# Patient Record
Sex: Male | Born: 2000 | Race: Black or African American | Hispanic: No | Marital: Single | Smoking: Never smoker
Health system: Southern US, Community
[De-identification: ages and names within clinical notes are randomized; demographics above are authoritative.]

## PROBLEM LIST (undated history)

## (undated) DIAGNOSIS — Z91018 Allergy to other foods: Secondary | ICD-10-CM

## (undated) DIAGNOSIS — E669 Obesity, unspecified: Secondary | ICD-10-CM

## (undated) DIAGNOSIS — F419 Anxiety disorder, unspecified: Secondary | ICD-10-CM

## (undated) DIAGNOSIS — F988 Other specified behavioral and emotional disorders with onset usually occurring in childhood and adolescence: Secondary | ICD-10-CM

## (undated) DIAGNOSIS — T7840XA Allergy, unspecified, initial encounter: Secondary | ICD-10-CM

## (undated) DIAGNOSIS — E739 Lactose intolerance, unspecified: Secondary | ICD-10-CM

## (undated) HISTORY — DX: Allergy, unspecified, initial encounter: T78.40XA

## (undated) HISTORY — DX: Anxiety disorder, unspecified: F41.9

## (undated) HISTORY — DX: Other specified behavioral and emotional disorders with onset usually occurring in childhood and adolescence: F98.8

## (undated) HISTORY — DX: Allergy to other foods: Z91.018

## (undated) HISTORY — DX: Lactose intolerance, unspecified: E73.9

## (undated) HISTORY — DX: Obesity, unspecified: E66.9

## (undated) HISTORY — PX: HERNIA REPAIR: SHX51

---
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
## 2022-01-03 DIAGNOSIS — F4010 Social phobia, unspecified: Secondary | ICD-10-CM | POA: Diagnosis not present

## 2022-01-17 DIAGNOSIS — F4010 Social phobia, unspecified: Secondary | ICD-10-CM | POA: Diagnosis not present

## 2022-01-31 DIAGNOSIS — F4010 Social phobia, unspecified: Secondary | ICD-10-CM | POA: Diagnosis not present

## 2022-02-13 DIAGNOSIS — F4010 Social phobia, unspecified: Secondary | ICD-10-CM | POA: Diagnosis not present

## 2022-02-22 ENCOUNTER — Ambulatory Visit (INDEPENDENT_AMBULATORY_CARE_PROVIDER_SITE_OTHER): Payer: Medicaid Other | Admitting: Family Medicine

## 2022-02-22 ENCOUNTER — Encounter: Payer: Self-pay | Admitting: Family Medicine

## 2022-02-22 VITALS — BP 124/70 | HR 76 | Temp 97.5°F | Ht 73.0 in | Wt 236.0 lb

## 2022-02-22 DIAGNOSIS — Z113 Encounter for screening for infections with a predominantly sexual mode of transmission: Secondary | ICD-10-CM

## 2022-02-22 DIAGNOSIS — Z23 Encounter for immunization: Secondary | ICD-10-CM | POA: Diagnosis not present

## 2022-02-22 NOTE — Progress Notes (Signed)
[facility] [address] [place] [address] Dept: [phone] Dept Fax: [phone]  Office Visit  Subjective:    Patient ID: [name], male    DOB: 05/14/2000, 22 y.o..   MRN: [id number]  Chief Complaint  Patient presents with   Follow-up    F/u for shot updates and wants to get STD testing.      History of Present Illness:  Patient is in today requesting STD testing. [name] is attending [employer]. He notes he has had a sexual contact who contracted chlamydia. He does use condoms, but has had condom breakage with this partner. He denies any symptoms of dysuria or penile discharge.  Past Medical History: Patient Active Problem List   Diagnosis Date Noted   Depression with anxiety 09/27/2021   Prediabetes 08/05/2019   Vitamin D deficiency 08/05/2019   Blister of scrotum without infection 01/13/2019   Migraine without aura and without status migrainosus, not intractable 12/18/2018   Allergic rhinitis 01/13/2013   Class 1 obesity 10/18/2010    Class: Chronic   Past Surgical History:  Procedure Laterality Date   HERNIA REPAIR     KNEE ARTHROSCOPY WITH ANTERIOR CRUCIATE LIGAMENT (ACL) REPAIR WITH HAMSTRING GRAFT Left 01/18/2016   Procedure: KNEE ARTHROSCOPY WITH ANTERIOR CRUCIATE LIGAMENT (ACL) REPAIR WITH HAMSTRING GRAFT;  Surgeon: Ninetta Lights, MD;  Location: [facility];  Service: Orthopedics;  Laterality: Left;  Exparel injection   KNEE ARTHROSCOPY WITH LATERAL MENISECTOMY Left 01/18/2016   Procedure: KNEE ARTHROSCOPY WITH LATERAL MENISECTOMY;  Surgeon: Ninetta Lights, MD;  Location: [place];  Service: Orthopedics;  Laterality: Left;   Family History  Problem Relation Age of Onset   Cancer Mother        Cervical, breast   Depression Mother    Anxiety disorder Mother    Diabetes Mother    Hypertension Mother    Bipolar disorder Mother     Migraines Father    Alcoholism Father    Drug abuse Father    Bipolar disorder Father    Mental illness Maternal Grandmother    Hypertension Maternal Grandmother    Sarcoidosis Maternal Grandmother    Stroke Maternal Grandfather    Migraines Maternal Great-grandmother    Autism Neg Hx    ADD / ADHD Neg Hx    Schizophrenia Neg Hx     Outpatient Medications Prior to Visit  Medication Sig Dispense Refill   clindamycin-benzoyl peroxide (BENZACLIN) gel Apply topically 2 (two) times daily. (Patient not taking: Reported on 02/22/2022)     ipratropium (ATROVENT) 0.03 % nasal spray Place 2 sprays into both nostrils 3 (three) times daily as needed for rhinitis. (Patient not taking: Reported on 02/22/2022) 30 mL 0   No facility-administered medications prior to visit.   Allergies  Allergen Reactions   Other Itching    Bananas - throat itchy   Pineapple Nausea And Vomiting     Objective:   Today's Vitals   02/22/22 1501  BP: 124/70  Pulse: 76  Temp: (!) 97.5 F (36.4 C)  TempSrc: Temporal  SpO2: 98%  Weight: 236 lb (107 kg)  Height: 6\' 1"  (1.854 m)   Body mass index is 31.14 kg/m.   General: Well developed, well nourished. No acute distress. Psych: Alert and oriented. Normal mood and affect.  Health Maintenance Due  Topic Date Due   HIV Screening  Never done   Hepatitis C Screening  Never done  Assessment & Plan:   1. Screen for STD (sexually transmitted disease) We will do screening today. I did counsel [name] on condom use ot prevent STDs.  - Chlamydia/GC NAA, Confirmation - HIV Antibody (routine testing w rflx) - RPR  2. Need for meningococcal vaccination [name] is living in a college dorm. As this increases risk, I recommend he be vaccinated against meningococcal B. He can return in 1 month to complete the series.  - Meningococcal B, OMV (Bexsero)   Return in about 4 weeks (around 03/22/2022) for Immunizaiton (medical assistant appointment).   Haydee Salter, MD

## 2022-02-25 LAB — HIV ANTIBODY (ROUTINE TESTING W REFLEX): HIV 1&2 Ab, 4th Generation: NONREACTIVE

## 2022-02-25 LAB — SYPHILIS: RPR W/REFLEX TO RPR TITER AND TREPONEMAL ANTIBODIES, TRADITIONAL SCREENING AND DIAGNOSIS ALGORITHM: RPR Ser Ql: NONREACTIVE

## 2022-02-26 LAB — CHLAMYDIA/GC NAA, CONFIRMATION
Chlamydia trachomatis, NAA: NEGATIVE
Neisseria gonorrhoeae, NAA: NEGATIVE

## 2022-03-11 DIAGNOSIS — F4010 Social phobia, unspecified: Secondary | ICD-10-CM | POA: Diagnosis not present

## 2022-04-05 ENCOUNTER — Encounter: Payer: Self-pay | Admitting: Family Medicine

## 2022-04-05 ENCOUNTER — Ambulatory Visit (INDEPENDENT_AMBULATORY_CARE_PROVIDER_SITE_OTHER): Payer: Medicaid Other | Admitting: Family Medicine

## 2022-04-05 VITALS — BP 116/68 | HR 74 | Temp 97.7°F | Ht 73.0 in | Wt 236.6 lb

## 2022-04-05 DIAGNOSIS — Z23 Encounter for immunization: Secondary | ICD-10-CM | POA: Diagnosis not present

## 2022-04-05 DIAGNOSIS — I7300 Raynaud's syndrome without gangrene: Secondary | ICD-10-CM | POA: Diagnosis not present

## 2022-04-05 NOTE — Assessment & Plan Note (Signed)
The history sounds like possible episodes of Raynaud's syndrome. I did discuss that this can sometimes be related to smoking. I will check screening labs for autoimmune issues. We should monitor this for now.

## 2022-04-05 NOTE — Progress Notes (Signed)
[name] [address] [place] [address] Dept: [phone] Dept Fax: [phone]  Office Visit  Subjective:    Patient ID: [name], male    DOB: March 01, 2000, 22 y.o..   MRN: [id number]  Chief Complaint  Patient presents with   Acute Visit    Bilateral hand numbness   History of Present Illness:  Patient is in today for evaluation of numbness in both hands. He notes this primarily involves his fingertips. He has times where the fingers look red, esp. when he goes out in cold weather. This effects the ulnar side of the hand. At other times, [name] notes his 4th and 5th toes can appear white. He does not necessarily have pain associated with this. [name] smokes marijuana occasionally and does use a cigar leaf wrapper for this. He notes his mother was concerned about the numbness and color change of his fingers.  Past Medical History: Patient Active Problem List   Diagnosis Date Noted   Depression with anxiety 09/27/2021   Prediabetes 08/05/2019   Vitamin D deficiency 08/05/2019   Blister of scrotum without infection 01/13/2019   Migraine without aura and without status migrainosus, not intractable 12/18/2018   Allergic rhinitis 01/13/2013   Class 1 obesity 10/18/2010    Class: Chronic   Past Surgical History:  Procedure Laterality Date   HERNIA REPAIR     KNEE ARTHROSCOPY WITH ANTERIOR CRUCIATE LIGAMENT (ACL) REPAIR WITH HAMSTRING GRAFT Left 01/18/2016   Procedure: KNEE ARTHROSCOPY WITH ANTERIOR CRUCIATE LIGAMENT (ACL) REPAIR WITH HAMSTRING GRAFT;  Surgeon: Ninetta Lights, MD;  Location: [facility];  Service: Orthopedics;  Laterality: Left;  Exparel injection   KNEE ARTHROSCOPY WITH LATERAL MENISECTOMY Left 01/18/2016   Procedure: KNEE ARTHROSCOPY WITH LATERAL MENISECTOMY;  Surgeon: Ninetta Lights, MD;  Location: [facility];  Service: Orthopedics;  Laterality: Left;   Family  History  Problem Relation Age of Onset   Cancer Mother        Cervical, breast   Depression Mother    Anxiety disorder Mother    Diabetes Mother    Hypertension Mother    Bipolar disorder Mother    Migraines Father    Alcoholism Father    Drug abuse Father    Bipolar disorder Father    Mental illness Maternal Grandmother    Hypertension Maternal Grandmother    Sarcoidosis Maternal Grandmother    Stroke Maternal Grandfather    Migraines Maternal Great-grandmother    Autism Neg Hx    ADD / ADHD Neg Hx    Schizophrenia Neg Hx    Outpatient Medications Prior to Visit  Medication Sig Dispense Refill   clindamycin-benzoyl peroxide (BENZACLIN) gel Apply topically 2 (two) times daily.     ipratropium (ATROVENT) 0.03 % nasal spray Place 2 sprays into both nostrils 3 (three) times daily as needed for rhinitis. 30 mL 0   No facility-administered medications prior to visit.   Allergies  Allergen Reactions   Other Itching    Bananas - throat itchy   Pineapple Nausea And Vomiting     Objective:   Today's Vitals   04/05/22 1446  BP: 116/68  Pulse: 74  Temp: 97.7 F (36.5 C)  TempSrc: Temporal  SpO2: 98%  Weight: 236 lb 9.6 oz (107.3 kg)  Height: 6' 1"$  (1.854 m)   Body mass index is 31.22 kg/m.   General: Well developed, well nourished. No acute distress. Extremities: Full ROM. No joint  swelling or tenderness. No color change at present. Tinel's and Phalen's tests are   negative. No edema noted. Psych: Alert and oriented. Normal mood and affect.  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 I did discuss that this can sometimes be related to smoking. I will check screening labs for autoimmune issues. We should monitor this for now.       Relevant Orders   ANA   Rheumatoid factor   Sedimentation rate   C-reactive protein   Other Visit Diagnoses     Need for meningococcal vaccination       Relevant Orders   Meningococcal B, OMV (Bexsero) (Completed)      Return if symptoms worsen or fail to improve.   Haydee Salter, MD

## 2022-04-06 LAB — C-REACTIVE PROTEIN: CRP: <1 mg/L (ref 0–10)

## 2022-04-06 LAB — SEDIMENTATION RATE: Sed Rate: 2 mm/hr (ref 0–15)

## 2022-04-06 LAB — RHEUMATOID FACTOR: Rheumatoid fact SerPl-aCnc: <10 IU/mL (ref <14.0)

## 2022-04-06 LAB — ANA: Anti Nuclear Antibody (ANA): NEGATIVE

## 2022-04-08 DIAGNOSIS — F4010 Social phobia, unspecified: Secondary | ICD-10-CM | POA: Diagnosis not present

## 2022-04-29 DIAGNOSIS — F4010 Social phobia, unspecified: Secondary | ICD-10-CM | POA: Diagnosis not present

## 2022-05-06 DIAGNOSIS — F4010 Social phobia, unspecified: Secondary | ICD-10-CM | POA: Diagnosis not present

## 2022-05-09 DIAGNOSIS — F4010 Social phobia, unspecified: Secondary | ICD-10-CM | POA: Diagnosis not present

## 2022-06-06 DIAGNOSIS — F4010 Social phobia, unspecified: Secondary | ICD-10-CM | POA: Diagnosis not present

## 2022-06-18 DIAGNOSIS — F4010 Social phobia, unspecified: Secondary | ICD-10-CM | POA: Diagnosis not present

## 2022-07-24 DIAGNOSIS — F4010 Social phobia, unspecified: Secondary | ICD-10-CM | POA: Diagnosis not present

## 2022-09-12 DIAGNOSIS — F4010 Social phobia, unspecified: Secondary | ICD-10-CM | POA: Diagnosis not present

## 2022-09-23 ENCOUNTER — Encounter: Payer: Self-pay | Admitting: Family Medicine

## 2022-09-23 ENCOUNTER — Ambulatory Visit (INDEPENDENT_AMBULATORY_CARE_PROVIDER_SITE_OTHER): Payer: Medicaid Other | Admitting: Family Medicine

## 2022-09-23 VITALS — BP 116/70 | HR 62 | Temp 98.4°F | Ht 73.0 in | Wt 215.6 lb

## 2022-09-23 DIAGNOSIS — L739 Follicular disorder, unspecified: Secondary | ICD-10-CM

## 2022-09-23 DIAGNOSIS — Z0000 Encounter for general adult medical examination without abnormal findings: Secondary | ICD-10-CM | POA: Diagnosis not present

## 2022-09-23 MED ORDER — CEPHALEXIN 500 MG PO CAPS: 500.0000 mg | ORAL_CAPSULE | Freq: Four times a day (QID) | ORAL | 0 refills | Status: AC

## 2022-09-23 NOTE — Progress Notes (Signed)
[facility] [address] [place] [address] Dept: [phone] Dept Fax: [phone]  Annual Physical Visit  Subjective:    Patient ID: [name], male    DOB: [birth year], 22 y.o..   MRN: [id number]  Chief Complaint  Patient presents with   Annual Exam    CPE/labs c/o having spots on lower RT leg.    History of Present Illness:  Patient is in today for an annual physical/preventative visit.  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 He will be repeating is accounting course. He has been working for [employer] this summer and is getting quite a workout daily with this.  Review of Systems  Constitutional:  Negative for chills, diaphoresis, fever, malaise/fatigue and weight loss.  HENT:  Negative for congestion, ear pain, hearing loss, sinus pain, sore throat and tinnitus.   Eyes:  Negative for blurred vision, pain, discharge and redness.  Respiratory:  Negative for cough, shortness of breath and wheezing.   Cardiovascular:  Negative for chest pain and palpitations.  Gastrointestinal:  Negative for abdominal pain, constipation, diarrhea, heartburn, nausea and vomiting.  Musculoskeletal:  Negative for back pain, joint pain and myalgias.  Skin:  Positive for rash. Negative for itching.       Notes an area of rash on the right shin (lesser amount of left). He relates this to trauma from bumping his shin against bolt heads on shelf units in the [employer] trucks. He thinks this may be irritating the hair follicles.   Psychiatric/Behavioral:  Negative for depression. The patient is not nervous/anxious.    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 04/05/2022   Depression with anxiety 09/27/2021   Prediabetes 08/05/2019   Vitamin D deficiency 08/05/2019   Blister of scrotum without infection 01/13/2019   Migraine  without aura and without status migrainosus, not intractable 12/18/2018   Allergic rhinitis 01/13/2013   Past Surgical History:  Procedure Laterality Date   HERNIA REPAIR     KNEE ARTHROSCOPY WITH ANTERIOR CRUCIATE LIGAMENT (ACL) REPAIR WITH HAMSTRING GRAFT Left 01/18/2016   Procedure: KNEE ARTHROSCOPY WITH ANTERIOR CRUCIATE LIGAMENT (ACL) REPAIR WITH HAMSTRING GRAFT;  Surgeon: Loreta Ave, MD;  Location: [facility];  Service: Orthopedics;  Laterality: Left;  Exparel injection   KNEE ARTHROSCOPY WITH LATERAL MENISECTOMY Left 01/18/2016   Procedure: KNEE ARTHROSCOPY WITH LATERAL MENISECTOMY;  Surgeon: Loreta Ave, MD;  Location: [facility];  Service: Orthopedics;  Laterality: Left;   Family History  Problem Relation Age of Onset   Cancer Mother        Cervical, breast   Depression Mother    Anxiety disorder Mother    Diabetes Mother    Hypertension Mother    Bipolar disorder Mother    Migraines Father    Alcoholism Father    Drug abuse Father    Bipolar disorder Father    Mental illness Maternal Grandmother    Hypertension Maternal Grandmother    Sarcoidosis Maternal Grandmother    Stroke Maternal Grandfather    Migraines Maternal Great-grandmother    Autism Neg Hx    ADD / ADHD Neg Hx    Schizophrenia Neg Hx    Outpatient Medications Prior to Visit  Medication Sig Dispense Refill   clindamycin-benzoyl peroxide (BENZACLIN) gel Apply topically 2 (two) times daily.     ipratropium (ATROVENT) 0.03 % nasal spray Place  2 sprays into both nostrils 3 (three) times daily as needed for rhinitis. 30 mL 0   No facility-administered medications prior to visit.   Allergies  Allergen Reactions   Other Itching    Bananas - throat itchy   Pineapple Nausea And Vomiting   Objective:   Today's Vitals   09/23/22 1453  BP: 116/70  Pulse: 62  Temp: 98.4 F (36.9 C)  TempSrc: Temporal  SpO2: 98%  Weight: 215 lb 9.6 oz (97.8 kg)  Height: 6\' 1"   (1.854 m)   Body mass index is 28.44 kg/m.   General: Well developed, well nourished. No acute distress. HEENT: Normocephalic, non-traumatic. PERRL, EOMI. Conjunctiva clear. External ears normal. EAC and TMs   normal bilaterally. Nose clear without congestion or rhinorrhea. Mucous membranes moist. Oropharynx clear.   Good dentition. Neck: Supple. No lymphadenopathy. No thyromegaly. Lungs: Clear to auscultation bilaterally. No wheezing, rales or rhonchi. CV: RRR without murmurs or rubs. Pulses 2+ bilaterally. Abdomen: Soft, non-tender. Bowel sounds positive, normal pitch and frequency. No hepatosplenomegaly. No   rebound or guarding. Extremities: Full ROM. No joint swelling or tenderness. No edema noted. Skin: Warm and dry. There are scattered small red patches in a follicular pattern on the out aspect of the loer leg,   R>>L. There are a few pustules noted as well. Psych: Alert and oriented. Normal mood and affect.  Health Maintenance Due  Topic Date Due   Hepatitis C Screening  Never done     Assessment & Plan:   Problem List Items Addressed This Visit   None Visit Diagnoses     Annual physical exam    -  Primary   Overall excellent health. Congratulated on weight loss and encourage ongoing exercise. UTD on screenings and immunizations.   Folliculitis       I will treat this with a course of cephalexin. If not resovled with antibiotics, recommend he follow-up with me.   Relevant Medications   cephALEXin (KEFLEX) 500 MG capsule       Return in about 1 year (around 09/23/2023) for Annual preventative care.   Loyola Mast, MD

## 2022-10-01 DIAGNOSIS — F4010 Social phobia, unspecified: Secondary | ICD-10-CM | POA: Diagnosis not present

## 2022-10-17 DIAGNOSIS — F4010 Social phobia, unspecified: Secondary | ICD-10-CM | POA: Diagnosis not present

## 2022-12-16 DIAGNOSIS — F4010 Social phobia, unspecified: Secondary | ICD-10-CM | POA: Diagnosis not present

## 2023-01-28 DIAGNOSIS — F4010 Social phobia, unspecified: Secondary | ICD-10-CM | POA: Diagnosis not present

## 2023-02-04 DIAGNOSIS — F4010 Social phobia, unspecified: Secondary | ICD-10-CM | POA: Diagnosis not present

## 2023-03-03 DIAGNOSIS — F4010 Social phobia, unspecified: Secondary | ICD-10-CM | POA: Diagnosis not present

## 2023-04-08 DIAGNOSIS — F4010 Social phobia, unspecified: Secondary | ICD-10-CM | POA: Diagnosis not present

## 2023-05-05 DIAGNOSIS — F4010 Social phobia, unspecified: Secondary | ICD-10-CM | POA: Diagnosis not present

## 2023-05-09 DIAGNOSIS — F4010 Social phobia, unspecified: Secondary | ICD-10-CM | POA: Diagnosis not present

## 2023-06-03 DIAGNOSIS — F4010 Social phobia, unspecified: Secondary | ICD-10-CM | POA: Diagnosis not present

## 2023-07-01 DIAGNOSIS — F4010 Social phobia, unspecified: Secondary | ICD-10-CM | POA: Diagnosis not present
